# Patient Record
Sex: Male | Born: 1955 | Race: White | Hispanic: Yes | Marital: Married | Smoking: Former smoker
Health system: Southern US, Community
[De-identification: ages and names within clinical notes are randomized; demographics above are authoritative.]

---

## 2000-04-08 ENCOUNTER — Encounter: Payer: Self-pay | Admitting: Family Medicine

## 2000-04-08 ENCOUNTER — Ambulatory Visit (HOSPITAL_COMMUNITY): Admission: RE | Admit: 2000-04-08 | Discharge: 2000-04-08 | Payer: Self-pay | Admitting: Family Medicine

## 2001-02-11 ENCOUNTER — Ambulatory Visit (HOSPITAL_COMMUNITY): Admission: RE | Admit: 2001-02-11 | Discharge: 2001-02-11 | Payer: Self-pay | Admitting: Orthopedic Surgery

## 2001-02-11 ENCOUNTER — Encounter: Payer: Self-pay | Admitting: Orthopedic Surgery

## 2005-08-05 ENCOUNTER — Encounter: Admission: RE | Admit: 2005-08-05 | Discharge: 2005-08-05 | Payer: Self-pay | Admitting: Occupational Medicine

## 2006-06-05 ENCOUNTER — Ambulatory Visit: Payer: Self-pay | Admitting: Family Medicine

## 2006-06-05 LAB — CONVERTED CEMR LAB
HCT: 43.2 % (ref 39.0–52.0)
Hemoglobin: 14.8 g/dL (ref 13.0–17.0)
MCHC: 34.3 g/dL (ref 30.0–36.0)
MCV: 87.8 fL (ref 78.0–100.0)
PSA: 0.32 ng/mL (ref 0.10–4.00)
Platelets: 183 10*3/uL (ref 150–400)
RBC: 4.92 M/uL (ref 4.22–5.81)
RDW: 11.4 % — ABNORMAL LOW (ref 11.5–14.6)
WBC: 4.9 10*3/uL (ref 4.5–10.5)

## 2006-06-09 ENCOUNTER — Ambulatory Visit: Payer: Self-pay | Admitting: Gastroenterology

## 2006-06-15 ENCOUNTER — Ambulatory Visit: Payer: Self-pay | Admitting: Gastroenterology

## 2006-06-15 ENCOUNTER — Encounter (INDEPENDENT_AMBULATORY_CARE_PROVIDER_SITE_OTHER): Payer: Self-pay | Admitting: Specialist

## 2006-06-30 ENCOUNTER — Ambulatory Visit: Payer: Self-pay | Admitting: Gastroenterology

## 2007-06-29 ENCOUNTER — Ambulatory Visit: Payer: Self-pay | Admitting: Cardiology

## 2007-07-12 ENCOUNTER — Ambulatory Visit: Payer: Self-pay

## 2007-07-15 HISTORY — PX: SHOULDER ARTHROSCOPY: SHX128

## 2007-07-23 ENCOUNTER — Ambulatory Visit: Payer: Self-pay

## 2007-07-23 ENCOUNTER — Encounter (INDEPENDENT_AMBULATORY_CARE_PROVIDER_SITE_OTHER): Payer: Self-pay | Admitting: Family Medicine

## 2007-07-23 ENCOUNTER — Ambulatory Visit: Payer: Self-pay | Admitting: Cardiology

## 2010-08-04 ENCOUNTER — Encounter: Payer: Self-pay | Admitting: Occupational Medicine

## 2010-11-26 NOTE — Assessment & Plan Note (Signed)
[facility]                            CARDIOLOGY OFFICE NOTE   Phillip Lucero, Phillip Lucero               MRN:          [id number]  DATE:07/23/2007                            DOB:          January 15, 1956    PRIMARY CARE PHYSICIAN:  Leanne Chang, M.D.   REASON FOR VISIT:  Followup cardiac testing.   HISTORY OF PRESENT ILLNESS:  I saw Mr. Dimmer back in December.  He was referred at that time with an abnormal electrocardiogram in the  setting of baseline NYHA class II dyspnea and exertion without any  significant change and cardiac risk factors including age, gender,  tobacco use and mild hyperlipidemia treated with Tricor and omega-3  supplements.  I referred him for basic cardiac risk stratification  including a surface echocardiogram which showed normal ventricular  systolic function with an ejection fraction of 55-60% and mild diastolic  dysfunction without regional wall motion abnormalities.  He had mild  mitral regurgitation.  He subsequently underwent an exercise  echocardiogram which showed no electrocardiographic changes to suggest  ischemia and overall normal wall motion with exercise.  I reviewed these  results with him today and reassured him.  This would suggest a low risk  of obstructive cardiovascular disease and in general suggests that he  should continue with basic risk factor modification strategies.  He was  comfortable with this.   ALLERGIES:  No known drug allergies.   PRESENT MEDICATIONS:  Omega-3 supplements, hydrocodone p.r.n., Robaxin  p.r.n., zolpidem 10 mg p.o. daily at bedtime, omeprazole 20 mg p.o.  b.i.d., TriCor.   REVIEW OF SYSTEMS:  As described in history of present illness.   PHYSICAL EXAMINATION:  VITAL SIGNS:  Blood pressure is 125/84, heart  rate is in the 80s, weight 187 pounds.  GENERAL:  The patient is comfortable and in no acute distress.  He is  wearing a sling on his right arm.  NECK:  Neck  reveals no elevated jugular venous pressure, no loud bruits.  No thyromegaly noted.  LUNGS:  Lungs are clear without heavy breathing at rest.  CARDIAC:  Exam reveals a regular rate and rhythm.  No loud murmur, S3 or  gallop.  EXTREMITIES:  Show no pitting edema.   IMPRESSION AND RECOMMENDATIONS:  1. Reassuring cardiac evaluation showing normal left ventricular      systolic function and no clear evidence of ischemic heart disease      based on an exercise echocardiogram.  I suspect that his      electrocardiographic abnormalities are rather nonspecific and that      he should proceed with basic plan for risk factor modification.  He      states that he is interested in taking a baby aspirin a day.  I      have encouraged him to keep a close eye on his cholesterol with Dr.      Blossom Hoops.  He states that he has stopped smoking and I      congratulated him on this.  2. In addition, Mr. Glaude states that he is supposed to have      shoulder surgery in the near  future with Dr. Ranell Patrick of [facility].  I think that the patient should be able to proceed      with an adequate perioperative cardiac risk based on his recent      evaluation.     Jonelle Sidle, MD  Electronically Signed    SGM/MedQ  DD: 07/23/2007  DT: 07/23/2007  Job #: [id number]   cc:   Leanne Chang, M.D.  Almedia Balls. Ranell Patrick, M.D.

## 2010-11-26 NOTE — Assessment & Plan Note (Signed)
[facility]                            CARDIOLOGY OFFICE NOTE   DESTINE, AMBROISE               MRN:          [id number]  DATE:06/29/2007                            DOB:          [birth year]    CARDIOLOGY CONSULTATION   PRIMARY CARE PHYSICIAN:  Leanne Chang, M.D.   REASON FOR CONSULTATION:  Abnormal electrocardiogram.   HISTORY OF PRESENT ILLNESS:  Mr. Innocent is a pleasant 55-year-  old male with a reported history of mild hyperlipidemia treated with  omega 3 supplements and no documented history of cardiovascular disease  or myocardial infarction.  He reports being told that he has had an  abnormal electrocardiogram before and may have in fact undergone prior  stress testing approximately 2-3 years ago, although he cannot recall  the specifics.  He reported recently for a routine yearly physical and  saw Dr. Neva Seat at [facility].  He was told at that  time he had an abnormal electrocardiogram and in retrospect thought he  may have been having some shortness of breath, although today he tells  me that overall he has been feeling fine until recently.  He has been  worried about his heart because of his electrocardiogram.  He was told  that he may have had an old myocardial infarction affecting the inferior  wall.  In reviewing his electrocardiogram available today, he has  technically nondiagnostic Q waves in leads II, III, and aVF with counter-  clockwise rotation.  Today's electrocardiogram shows no clear evidence  of previous inferior wall myocardial infarction.  He has small R primes  in leads V1 and V2 and a vertical axis.  He denies having any clear  exertional chest pain.  He describes NYHA class II dyspnea on exertion  without any major progression.  He has not had any recent ischemic  evaluation.   ALLERGIES:  No known drug allergies.   CURRENT MEDICATIONS:  1. Omega 3 supplements.  2.  Hydrocodone 5/500 p.r.n.  3. Robaxin 500 p.r.n.  4. Zolpidem 10 mg p.r.n. nightly.  5. Prilosec 20 mg p.o. b.i.d.   PAST MEDICAL HISTORY:  As outlined above.  Patient reports problems with  reflux.  He denies any previous surgeries.  He has had some right  shoulder discomfort and may be in need of surgery for this.  No obvious  problems with diabetes mellitus, hypertension, or cardiovascular  disease.   REVIEW OF SYSTEMS:  As described in the history of present illness.  Patient describes seasonal allergies, reflux, some history of erectile  dysfunction.   FAMILY HISTORY:  Reviewed.  It is noncontributory to premature  cardiovascular disease.   SOCIAL HISTORY:  Patient is remarried.  He has five children.  He has a  tobacco use history, although was able to quit for two years recently.  He smokes approximately eight cigarettes a day and plans to quit at the  beginning of the year.  He is not exercising regularly.  He works as an  Retail banker at [employer].   PHYSICAL EXAMINATION:  Blood pressure is 125/78.  Heart rate is 65.  Weight is 185  pounds.  A normally nourished-appearing male in no acute distress.  HEENT:  Conjunctivae and lids normal.  Oropharynx is clear.  NECK:  Supple. No elevated jugular venous distention or loud bruits.  No  thyromegaly is noted.  LUNGS:  Clear without labored breathing at rest.  CARDIAC:  Regular rate and rhythm.  Normal first and second heart  sounds.  No S3 gallop or pericardial rub.  ABDOMEN:  Soft and nontender.  EXTREMITIES:  No pitting edema.  Skin is warm and dry.  MUSCULOSKELETAL:  No kyphosis is noted.  Distal pulses are 2+.  NEURO/PSYCHIATRIC:  Patient is alert and oriented x3.  Affect is normal.   IMPRESSION/RECOMMENDATIONS:  1. Abnormal electrocardiogram without frank evidence of previous      inferior wall myocardial infarction.  I have no other old tracings      for comparison.  Symptomatically, the patient has NYHA class II       dyspnea on exertion which is not significantly changed, and he      denies any active anginal chest pain.  Principle risk factors      include age, gender, tobacco use, and mild hyperlipidemia.  Our      plan will be an exercise Myoview and echocardiogram for further      risk stratification.  He will then follow up in the office to      discuss results.  2. Further plans to follow.     Jonelle Sidle, MD  Electronically Signed    SGM/MedQ  DD: 06/29/2007  DT: 06/30/2007  Job #: [id number]   cc:   Leanne Chang, M.D.

## 2010-11-29 NOTE — Assessment & Plan Note (Signed)
[facility]                         GASTROENTEROLOGY OFFICE NOTE   [name], [name]               MRN:          [id number]  DATE:06/30/2006                            DOB:          [birth year]    Phillip Lucero's colonoscopy was unremarkable.  I suspect he had some rectal  irritation which has resolved.  His endoscopic exam showed a hiatal  hernia with 5 cm of rather severe esophagitis.  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   He is currently asymptomatic on Prevacid 30 mg a day, whereas he had no  response to previous omeprazole 40 mg a day.  I have changed him to  Aciphex 20 mg a day to abide with his drug plan.  He is to continue his  Benefiber and daily liberal p.o. fluids.  I have discussed the reflux  regimen with him.  We will see him back in 6 months' time on a p.r.n.  basis as needed.  He will need followup endoscopic exam in 2 years'  time.     Vania Rea. Jarold Motto, MD, Caleen Essex, FAGA  Electronically Signed    DRP/MedQ  DD: 06/30/2006  DT: 07/01/2006  Job #: [id number]   cc:   Leanne Chang, M.D.

## 2010-11-29 NOTE — Assessment & Plan Note (Signed)
[facility]                           GASTROENTEROLOGY OFFICE NOTE   NGUYEN, TODOROV               MRN:          [id number]  DATE:06/09/2006                            DOB:          Oct 06, 1955    Mr. Phillip Lucero is a 55 year old [place] referred by Dr.  Blossom Hoops for evaluation of some rectal bleeding and painful hemorrhoid  problems.   Mr. Phillip Lucero has had some irregular bowel movements associated with rectal  pain and bleeding over the last few weeks. He denies abdominal pain or other  lower gastrointestinal problems. He does have chronic acid reflux for  greater than 10 years and he has used over-the-counter antacids and Pepcid  AC for many years. Most of his reflux is at night and associated with marked  regurgitation and he currently is on Omeprazole 20 mg twice a day with mild  improvement. He denies associated dysphagia or hepatobiliary complaints. He  has had previous lower abdominal pain which is related to lifting and had CT  scan of his pelvis performed in January 2007 which was otherwise  unremarkable. He did have a reaction to the contrast dye. CBC at that time  and PSA level were normal.   The patient follows a regular diet but does try to avoid tomato-based  products because of his acid reflux. He denies clay colored stools, dark  urine, icterus, fever or chills, anorexia or weight loss. He does not have  lactose intolerance.   PAST MEDICAL HISTORY:  Otherwise entirely noncontributory.   FAMILY HISTORY:  Remarkable for a brother who had prostate cancer and  several members with diabetes. There are no known gastrointestinal problems.   SOCIAL HISTORY:  The patient is married and lives with his wife and  children. He has 2 years of college education. He smokes 1/2 to 1 pack of  cigarettes per day and uses ethanol socially on weekends. He denies problems  with ethanol abuse.   MEDICATIONS:  1.  Omeprazole 40 mg a day.  2. Lopid 600 mg 2 a day.  3. Daily fish oil.  4. Denies drug allergies.   REVIEW OF SYSTEMS:  Entirely noncontributory. He does have some nonspecific  complaints such as chronic fatigue, myalgias, back pain, headaches, night  sweats, and mild lower extremity edema.   PHYSICAL EXAMINATION:  GENERAL:  He is a healthy-appearing [place] male in  no acute distress appearing his stated age.  VITAL SIGNS:  He is 5 feet 7 inches tall and weighs 179 pounds. Blood  pressure is 102/60, pulse was 68 and regular.  There were no stigmata of chronic liver disease noted or thyromegaly or  lymphadenopathy.  CHEST:  Entirely clear to percussion and auscultation.  CARDIAC:  He was in a regular rhythm without murmur, gallop or rub noted on  cardiac exam.  ABDOMEN:  I could not appreciate hepatosplenomegaly, abdominal masses or  tenderness.  EXTREMITIES:  Unremarkable.  MENTAL STATUS:  Clear.  RECTAL:  Inspection of the rectum was unremarkable as was rectal exam. Stool  was guaiac negative.   ASSESSMENT:  1. Chronic acid reflux - rule out Barrett's mucosa.  2. Recurrent  rectal bleeding probably from internal hemorrhoids - rule out      colon polyps.  3. Family history of prostate carcinoma with normal previous PSA level.   RECOMMENDATIONS:  1. Reflux regimen, continue twice a day Prilosec.  2. Outpatient endoscopic exam.  3. High fiber diet with daily Benefiber and liberal p.o. fluids.  4. Outpatient colonoscopy exam.  5. Continue other medications and followup with Dr.  Blossom Hoops as      previously planned.     Vania Rea. Jarold Motto, MD, Caleen Essex, FAGA  Electronically Signed    DRP/MedQ  DD: 06/09/2006  DT: 06/09/2006  Job #: [id number]

## 2016-06-23 ENCOUNTER — Encounter: Payer: Self-pay | Admitting: Physician Assistant

## 2016-06-23 ENCOUNTER — Ambulatory Visit (INDEPENDENT_AMBULATORY_CARE_PROVIDER_SITE_OTHER): Payer: 59 | Admitting: Physician Assistant

## 2016-06-23 VITALS — BP 133/69 | HR 84 | Ht 66.0 in | Wt 197.0 lb

## 2016-06-23 DIAGNOSIS — F5101 Primary insomnia: Secondary | ICD-10-CM

## 2016-06-23 DIAGNOSIS — J301 Allergic rhinitis due to pollen: Secondary | ICD-10-CM

## 2016-06-23 DIAGNOSIS — K2270 Barrett's esophagus without dysplasia: Secondary | ICD-10-CM | POA: Diagnosis not present

## 2016-06-23 DIAGNOSIS — Z9989 Dependence on other enabling machines and devices: Secondary | ICD-10-CM

## 2016-06-23 DIAGNOSIS — K2100 Gastro-esophageal reflux disease with esophagitis, without bleeding: Secondary | ICD-10-CM

## 2016-06-23 DIAGNOSIS — E118 Type 2 diabetes mellitus with unspecified complications: Secondary | ICD-10-CM

## 2016-06-23 DIAGNOSIS — Z1211 Encounter for screening for malignant neoplasm of colon: Secondary | ICD-10-CM

## 2016-06-23 DIAGNOSIS — M25511 Pain in right shoulder: Secondary | ICD-10-CM

## 2016-06-23 DIAGNOSIS — N521 Erectile dysfunction due to diseases classified elsewhere: Secondary | ICD-10-CM

## 2016-06-23 DIAGNOSIS — E785 Hyperlipidemia, unspecified: Secondary | ICD-10-CM

## 2016-06-23 DIAGNOSIS — G4700 Insomnia, unspecified: Secondary | ICD-10-CM | POA: Insufficient documentation

## 2016-06-23 DIAGNOSIS — G8929 Other chronic pain: Secondary | ICD-10-CM

## 2016-06-23 DIAGNOSIS — N529 Male erectile dysfunction, unspecified: Secondary | ICD-10-CM | POA: Insufficient documentation

## 2016-06-23 DIAGNOSIS — K219 Gastro-esophageal reflux disease without esophagitis: Secondary | ICD-10-CM | POA: Insufficient documentation

## 2016-06-23 DIAGNOSIS — E1140 Type 2 diabetes mellitus with diabetic neuropathy, unspecified: Secondary | ICD-10-CM | POA: Insufficient documentation

## 2016-06-23 DIAGNOSIS — Z87891 Personal history of nicotine dependence: Secondary | ICD-10-CM

## 2016-06-23 DIAGNOSIS — G4733 Obstructive sleep apnea (adult) (pediatric): Secondary | ICD-10-CM

## 2016-06-23 LAB — POCT GLYCOSYLATED HEMOGLOBIN (HGB A1C): HEMOGLOBIN A1C: 6.9

## 2016-06-23 MED ORDER — GLIMEPIRIDE 2 MG PO TABS: ORAL_TABLET | ORAL | 1 refills | Status: DC

## 2016-06-23 MED ORDER — OMEPRAZOLE 40 MG PO CPDR
40.0000 mg | DELAYED_RELEASE_CAPSULE | Freq: Every day | ORAL | 1 refills | Status: AC
Start: 2016-06-23 — End: ?

## 2016-06-23 MED ORDER — LISINOPRIL 10 MG PO TABS: 10.0000 mg | ORAL_TABLET | Freq: Every day | ORAL | 1 refills | Status: AC

## 2016-06-23 MED ORDER — DICLOFENAC SODIUM 2 % TD SOLN: TRANSDERMAL | 1 refills | Status: DC

## 2016-06-23 MED ORDER — GABAPENTIN 300 MG PO CAPS: 300.0000 mg | ORAL_CAPSULE | Freq: Every day | ORAL | 1 refills | Status: AC

## 2016-06-23 MED ORDER — ATORVASTATIN CALCIUM 20 MG PO TABS: 20.0000 mg | ORAL_TABLET | Freq: Every day | ORAL | 1 refills | Status: AC

## 2016-06-23 MED ORDER — METFORMIN HCL ER 500 MG PO TB24
ORAL_TABLET | ORAL | 1 refills | Status: DC
Start: 2016-06-23 — End: 2016-09-22

## 2016-06-23 MED ORDER — FEXOFENADINE HCL 180 MG PO TABS: 180.0000 mg | ORAL_TABLET | Freq: Every day | ORAL | 1 refills | Status: AC

## 2016-06-23 MED ORDER — SILDENAFIL CITRATE 20 MG PO TABS: ORAL_TABLET | ORAL | 1 refills | Status: DC

## 2016-06-23 MED ORDER — ZOLPIDEM TARTRATE ER 6.25 MG PO TBCR: 6.2500 mg | EXTENDED_RELEASE_TABLET | Freq: Every day | ORAL | 1 refills | Status: DC

## 2016-06-23 NOTE — Patient Instructions (Signed)
Find out when last pneuomoccal 23 vaccine was.  Will schedule for endoscopy/colonoscopy.

## 2016-06-23 NOTE — Progress Notes (Signed)
Subjective:    Patient ID: Phillip Lucero, male    DOB: February 12, 1956, 60 y.o.   MRN: [name]  HPI  Pt is a 60 yo male who presents to the clinic to establish care.   .. Active Ambulatory Problems    Diagnosis Date Noted  . Hyperlipidemia 06/23/2016  . Diabetic neuropathy associated with type 2 diabetes mellitus (HCC) 06/23/2016  . GERD (gastroesophageal reflux disease) 06/23/2016  . Barrett esophagus 06/23/2016  . Insomnia 06/23/2016  . Erectile dysfunction 06/23/2016  . OSA on CPAP 06/23/2016  . Type 2 diabetes mellitus with complication, without long-term current use of insulin (HCC) 06/26/2016   Resolved Ambulatory Problems    Diagnosis Date Noted  . No Resolved Ambulatory Problems   No Additional Past Medical History   .Phillip Lucero. Social History   Social History  . Marital status: Married    Spouse name: N/A  . Number of children: N/A  . Years of education: N/A   Occupational History  . Not on file.   Social History Main Topics  . Smoking status: Former Games developermoker  . Smokeless tobacco: Never Used  . Alcohol use Yes  . Drug use: No  . Sexual activity: Yes   Other Topics Concern  . Not on file   Social History Narrative  . No narrative on file   Pt needs routine follow up on medical conditions.   DM type II- seems to be doing well. Taking oral medications. Neuropathy controlled. Not checking sugars regularly. No hypoglycemic events. No wound or sores on extermities.   Pt has GERD/Barretts and cannot take NSAIDs. He has right shoulder pain ongoing for last year. Surgery in 2009. Struggling to pick up things with right hand. Hurts with any ROM. Some numbness radiating into right pinky and ring finger.     Review of Systems  All other systems reviewed and are negative.      Objective:   Physical Exam  Constitutional: He is oriented to person, place, and time. He appears well-developed and well-nourished.  HENT:  Head: Normocephalic and atraumatic.   Cardiovascular: Normal rate, regular rhythm and normal heart sounds.   Pulmonary/Chest: Effort normal and breath sounds normal.  Musculoskeletal:  Tenderness over acromion and anterior shoulder over bicep tendon insertion.  Limited ROM due to pain at 110 degrees abduction. Pain worsens with abduction.  Positive hawkins.  Strength decreased on right side 3-4/5.   Neurological: He is alert and oriented to person, place, and time.  Skin: Skin is dry.  Psychiatric: He has a normal mood and affect. His behavior is normal.          Assessment & Plan:  Phillip Lucero.Phillip Lucero.Phillip Lucero was seen today for establish care and diabetes.  Diagnoses and all orders for this visit:  Type 2 diabetes mellitus with complication, without long-term current use of insulin (HCC) -     POCT HgB A1C -     metFORMIN (GLUCOPHAGE-XR) 500 MG 24 hr tablet; TAKE 4 TABLETS BY MOUTH  WITH BREAKFAST AS DIRECTED -     lisinopril (PRINIVIL,ZESTRIL) 10 MG tablet; Take 1 tablet (10 mg total) by mouth daily. -     glimepiride (AMARYL) 2 MG tablet; TAKE 1 TABLET BY MOUTH 30  MINUTES BEFORE BREAKFAST AS DIRECTED -     gabapentin (NEURONTIN) 300 MG capsule; Take 1 capsule (300 mg total) by mouth at bedtime.  Hyperlipidemia, unspecified hyperlipidemia type -     atorvastatin (LIPITOR) 20 MG tablet; Take 1 tablet (20 mg total) by  mouth daily.  Gastroesophageal reflux disease with esophagitis -     omeprazole (PRILOSEC) 40 MG capsule; Take 1 capsule (40 mg total) by mouth daily. -     Ambulatory referral to Gastroenterology  Barrett's esophagus without dysplasia -     omeprazole (PRILOSEC) 40 MG capsule; Take 1 capsule (40 mg total) by mouth daily. -     Ambulatory referral to Gastroenterology  Primary insomnia -     zolpidem (AMBIEN CR) 6.25 MG CR tablet; Take 1 tablet (6.25 mg total) by mouth at bedtime.  Erectile dysfunction due to diseases classified elsewhere -     sildenafil (REVATIO) 20 MG tablet; TAKE AS FEW AS 2 AND AS MANY AS 5  AT LEAST 1 HOUR PRIOR TO SEX  OSA on CPAP  Chronic seasonal allergic rhinitis due to pollen -     fexofenadine (ALLEGRA) 180 MG tablet; Take 1 tablet (180 mg total) by mouth daily.  Chronic right shoulder pain -     Diclofenac Sodium (PENNSAID) 2 % SOLN; 2 pumps applied to painful area twice a day.  Former smoker -     Ambulatory referral to Gastroenterology  Colon cancer screening -     Ambulatory referral to Gastroenterology   A!C 6.9 today. At goal. Continue on medications.  Need to find out about pneumonia vaccines.  Pt declined flu shot.  Encouraged eye exam.   Declined xray today of shoulder. Discussed sports medicine appt would be good for evaluation. Pt request MRI to be ordered. He says insurance shouldn't give any problems. Will confirm if needs xray first.  Diclofenac topical  given due to barretts and GERD.  Seems like some impingement syndrome could be going on in the right shoulder. Exercises given.   GI referral made.   Needs CPE and labs.

## 2016-06-26 ENCOUNTER — Encounter: Payer: Self-pay | Admitting: Physician Assistant

## 2016-06-26 ENCOUNTER — Telehealth: Payer: Self-pay | Admitting: Physician Assistant

## 2016-06-26 DIAGNOSIS — E118 Type 2 diabetes mellitus with unspecified complications: Secondary | ICD-10-CM | POA: Insufficient documentation

## 2016-06-27 ENCOUNTER — Other Ambulatory Visit: Payer: Self-pay | Admitting: Physician Assistant

## 2016-06-27 DIAGNOSIS — N521 Erectile dysfunction due to diseases classified elsewhere: Secondary | ICD-10-CM

## 2016-07-21 ENCOUNTER — Encounter: Payer: Self-pay | Admitting: Physician Assistant

## 2016-07-21 ENCOUNTER — Ambulatory Visit (INDEPENDENT_AMBULATORY_CARE_PROVIDER_SITE_OTHER): Payer: 59 | Admitting: Physician Assistant

## 2016-07-21 VITALS — BP 145/65 | HR 92 | Ht 66.0 in | Wt 196.0 lb

## 2016-07-21 DIAGNOSIS — M25611 Stiffness of right shoulder, not elsewhere classified: Secondary | ICD-10-CM

## 2016-07-21 DIAGNOSIS — S46819A Strain of other muscles, fascia and tendons at shoulder and upper arm level, unspecified arm, initial encounter: Secondary | ICD-10-CM

## 2016-07-21 DIAGNOSIS — M25511 Pain in right shoulder: Secondary | ICD-10-CM | POA: Diagnosis not present

## 2016-07-21 DIAGNOSIS — F5101 Primary insomnia: Secondary | ICD-10-CM

## 2016-07-21 DIAGNOSIS — K439 Ventral hernia without obstruction or gangrene: Secondary | ICD-10-CM

## 2016-07-21 DIAGNOSIS — G8929 Other chronic pain: Secondary | ICD-10-CM

## 2016-07-21 DIAGNOSIS — IMO0001 Reserved for inherently not codable concepts without codable children: Secondary | ICD-10-CM

## 2016-07-21 MED ORDER — DICLOFENAC SODIUM 2 % TD SOLN: TRANSDERMAL | 1 refills | Status: DC

## 2016-07-21 MED ORDER — ZOLPIDEM TARTRATE 10 MG PO TABS: 10.0000 mg | ORAL_TABLET | Freq: Every evening | ORAL | 1 refills | Status: AC | PRN

## 2016-07-21 NOTE — Progress Notes (Signed)
   Subjective:    Patient ID: Phillip Lucero, male    DOB: [birth year], 61 y.o.   MRN: [id number]  HPI  Pt is a 61 yo male who presents to the clinic to follow up on right shoulder pain.   He never got diclofenac sent to pharmacy. Hx of right shoulder pain. Last surgery 2009 for rotator cuff with [facility].7 months after surgery almost all pain resolved. New pain started 3 months ago. Hard to lift grandchild. Pain with external rotation. Weakness in right extermity. He reports he does not need xray that insurance will pay for MRI.   He also has a bulge in his abdomen. Been present for 2 and 1/2 years. Was seen in [place] for this. They told him surgery was not an option. He wears a belt to help keep bulge in. Bothers him a lot. It is painful at times. He cannot exercise like he wants to due to discomfort.    Review of Systems  All other systems reviewed and are negative.      Objective:   Physical Exam  Constitutional: He is oriented to person, place, and time. He appears well-developed and well-nourished.  HENT:  Head: Normocephalic and atraumatic.  Cardiovascular: Normal rate, regular rhythm and normal heart sounds.   Pulmonary/Chest: Effort normal and breath sounds normal.  Abdominal:  Noted ventral hernia, reducible.   Musculoskeletal:  4/5 right upper extremity strength.  ROM 120 degrees abduction with pain assoicated.  Negative drop arm test.  Negative speeds test.  Positive hawkins.  Pain with all external rotation.  No tenderness to palpation of acromion and ant/posterior joint space.   Neurological: He is alert and oriented to person, place, and time.  Psychiatric: He has a normal mood and affect. His behavior is normal.          Assessment & Plan:  [name].[name].Diagnoses and all orders for this visit:  Chronic right shoulder pain -     MR SHOULDER RIGHT WO CONTRAST; Future -     Diclofenac Sodium (PENNSAID) 2 % SOLN; 2 pumps applied to painful area twice a  day. -     MR SHOULDER RIGHT WO CONTRAST  Decreased ROM of right shoulder -     MR SHOULDER RIGHT WO CONTRAST; Future -     MR SHOULDER RIGHT WO CONTRAST  Ventral hernia without obstruction or gangrene -     Ambulatory referral to General Surgery  Primary insomnia -     zolpidem (AMBIEN) 10 MG tablet; Take 1 tablet (10 mg total) by mouth at bedtime as needed for sleep.   Resent diclofenac topical to try.  Will follow up with sports med after MRI>  I suspect more impingement than rotator cuff pathology.  Will consider PT.   Discussed ventral hernia. Pt would like consult.   Colonoscopy schedule for January 19th.

## 2016-07-21 NOTE — Patient Instructions (Signed)
MRI ordered.  Diclofenac sent to pharmacy.

## 2016-07-23 DIAGNOSIS — G8929 Other chronic pain: Secondary | ICD-10-CM | POA: Insufficient documentation

## 2016-07-23 DIAGNOSIS — M25611 Stiffness of right shoulder, not elsewhere classified: Secondary | ICD-10-CM | POA: Insufficient documentation

## 2016-07-23 DIAGNOSIS — K439 Ventral hernia without obstruction or gangrene: Secondary | ICD-10-CM | POA: Insufficient documentation

## 2016-07-23 DIAGNOSIS — M25511 Pain in right shoulder: Principal | ICD-10-CM

## 2016-07-28 ENCOUNTER — Ambulatory Visit (INDEPENDENT_AMBULATORY_CARE_PROVIDER_SITE_OTHER): Payer: 59

## 2016-07-28 ENCOUNTER — Encounter: Payer: Self-pay | Admitting: Physician Assistant

## 2016-07-28 DIAGNOSIS — M7591 Shoulder lesion, unspecified, right shoulder: Secondary | ICD-10-CM

## 2016-07-28 DIAGNOSIS — M75101 Unspecified rotator cuff tear or rupture of right shoulder, not specified as traumatic: Secondary | ICD-10-CM | POA: Diagnosis not present

## 2016-07-28 DIAGNOSIS — IMO0001 Reserved for inherently not codable concepts without codable children: Secondary | ICD-10-CM | POA: Insufficient documentation

## 2016-07-28 DIAGNOSIS — Z9889 Other specified postprocedural states: Secondary | ICD-10-CM

## 2016-07-28 DIAGNOSIS — S46819A Strain of other muscles, fascia and tendons at shoulder and upper arm level, unspecified arm, initial encounter: Secondary | ICD-10-CM

## 2016-07-28 NOTE — Progress Notes (Signed)
MRI of shoulder shows inflammation in the tendon with a small bursal sided tear of one of the muscles of shoulders. I am going to refer you to ortho. Do you have a preference? Do you want to go back to [facility]?

## 2016-07-28 NOTE — Addendum Note (Signed)
Addended by: Jomarie LongsBREEBACK, Gladiola Madore L on: 07/28/2016 10:08 PM   Modules accepted: Orders

## 2016-09-05 ENCOUNTER — Other Ambulatory Visit: Payer: Self-pay | Admitting: Physician Assistant

## 2016-09-05 DIAGNOSIS — N521 Erectile dysfunction due to diseases classified elsewhere: Secondary | ICD-10-CM

## 2016-09-22 ENCOUNTER — Encounter: Payer: Self-pay | Admitting: Physician Assistant

## 2016-09-22 ENCOUNTER — Ambulatory Visit (INDEPENDENT_AMBULATORY_CARE_PROVIDER_SITE_OTHER): Payer: 59 | Admitting: Physician Assistant

## 2016-09-22 VITALS — BP 123/74 | HR 78 | Ht 66.0 in | Wt 197.0 lb

## 2016-09-22 DIAGNOSIS — M25511 Pain in right shoulder: Secondary | ICD-10-CM

## 2016-09-22 DIAGNOSIS — E118 Type 2 diabetes mellitus with unspecified complications: Secondary | ICD-10-CM

## 2016-09-22 DIAGNOSIS — G8929 Other chronic pain: Secondary | ICD-10-CM

## 2016-09-22 LAB — POCT GLYCOSYLATED HEMOGLOBIN (HGB A1C): HEMOGLOBIN A1C: 7.7

## 2016-09-22 MED ORDER — DICLOFENAC SODIUM 2 % TD SOLN: TRANSDERMAL | 1 refills | Status: AC

## 2016-09-22 MED ORDER — CANAGLIFLOZIN-METFORMIN HCL ER 150-1000 MG PO TB24: 1.0000 | ORAL_TABLET | Freq: Every day | ORAL | 2 refills | Status: DC

## 2016-09-22 MED ORDER — GLIMEPIRIDE 2 MG PO TABS: ORAL_TABLET | ORAL | 1 refills | Status: AC

## 2016-09-22 NOTE — Patient Instructions (Signed)
Added invokamet XR once a day in morning.  Glimperide 2mg  once daily in the morning.

## 2016-09-22 NOTE — Progress Notes (Signed)
   Subjective:    Patient ID: Phillip Lucero, male    DOB: 05-Jul-1956, 61 y.o.   MRN: [id number]  HPI Pt is a 61 yo male who presents to the clinic for 3 month DM check up. He is not checking sugars. He remembers to take his medications "most of the time". He denies any hypoglycemia, open sores, or open wounds. He does admit to eating more tortillas approximately 7-10 a day. He has not been exercising  due to weather.    Review of Systems  All other systems reviewed and are negative.      Objective:   Physical Exam  Constitutional: He is oriented to person, place, and time. He appears well-developed and well-nourished.  HENT:  Head: Normocephalic and atraumatic.  Cardiovascular: Normal rate, regular rhythm and normal heart sounds.   Pulmonary/Chest: Effort normal and breath sounds normal.  Neurological: He is alert and oriented to person, place, and time.  Psychiatric: He has a normal mood and affect. His behavior is normal.          Assessment & Plan:  [name].[name].Phillip Lucero was seen today for diabetes.  Diagnoses and all orders for this visit:  Type 2 diabetes mellitus with complication, without long-term current use of insulin (HCC) -     POCT HgB A1C -     Canagliflozin-Metformin HCl ER (INVOKAMET XR) [phone] MG TB24; Take 1 tablet by mouth daily. -     glimepiride (AMARYL) 2 MG tablet; TAKE 1 TABLET BY MOUTH 30  MINUTES BEFORE BREAKFAST AS DIRECTED  Chronic right shoulder pain -     Diclofenac Sodium (PENNSAID) 2 % SOLN; 2 pumps applied to painful area twice a day.   .. Lab Results  Component Value Date   HGBA1C 7.7 09/22/2016   Up from 6.9 3 months ago.  Encouraged to start limited tortillas and other carbs/sugars.  Start invokamet. Discussed side effects and black box warning.  Continue amaryl in am.  Follow up in 3 months.  Encouraged 150 minutes of exercise a week.   Screening showed several days of downness- discussed finding hobby, going out with friends,  finding purpose. Pt is not suicidal. Follow up as needed.

## 2016-09-24 ENCOUNTER — Encounter: Payer: Self-pay | Admitting: Physician Assistant

## 2016-09-24 ENCOUNTER — Ambulatory Visit (INDEPENDENT_AMBULATORY_CARE_PROVIDER_SITE_OTHER): Payer: 59 | Admitting: Physician Assistant

## 2016-09-24 ENCOUNTER — Telehealth: Payer: Self-pay

## 2016-09-24 VITALS — BP 138/76 | HR 87

## 2016-09-24 DIAGNOSIS — T887XXA Unspecified adverse effect of drug or medicament, initial encounter: Secondary | ICD-10-CM | POA: Diagnosis not present

## 2016-09-24 DIAGNOSIS — E118 Type 2 diabetes mellitus with unspecified complications: Secondary | ICD-10-CM

## 2016-09-24 DIAGNOSIS — T50905A Adverse effect of unspecified drugs, medicaments and biological substances, initial encounter: Secondary | ICD-10-CM

## 2016-09-24 LAB — BASIC METABOLIC PANEL WITH GFR
BUN: 20 mg/dL (ref 7–25)
CHLORIDE: 102 mmol/L (ref 98–110)
CO2: 27 mmol/L (ref 20–31)
CREATININE: 1.05 mg/dL (ref 0.70–1.33)
Calcium: 9.5 mg/dL (ref 8.6–10.3)
GFR, Est African American: 89 mL/min (ref >=60)
GFR, Est Non African American: 77 mL/min (ref >=60)
Glucose, Bld: 162 mg/dL — ABNORMAL HIGH (ref 65–99)
POTASSIUM: 4.8 mmol/L (ref 3.5–5.3)
Sodium: 138 mmol/L (ref 135–146)

## 2016-09-24 MED ORDER — SITAGLIP PHOS-METFORMIN HCL ER 50-1000 MG PO TB24: 1.0000 | ORAL_TABLET | Freq: Every day | ORAL | 2 refills | Status: AC

## 2016-09-24 NOTE — Telephone Encounter (Signed)
Pt thinks he is having a adverse reaction to Invokamet.  He started it yesterday, and today feels very tired, Sleepy, blurry eyes, and headache.  Please advise

## 2016-09-24 NOTE — Telephone Encounter (Signed)
Pt has appt today

## 2016-09-24 NOTE — Patient Instructions (Signed)
Sitagliptin oral tablet What is this medicine? SITAGLIPTIN (sit a GLIP tin) helps to treat type 2 diabetes. It helps to control blood sugar. Treatment is combined with diet and exercise. This medicine may be used for other purposes; ask your health care provider or pharmacist if you have questions. COMMON BRAND NAME(S): Januvia What should I tell my health care provider before I take this medicine? They need to know if you have any of these conditions: -diabetic ketoacidosis -kidney disease -pancreatitis -previous swelling of the tongue, face, or lips with difficulty breathing, difficulty swallowing, hoarseness, or tightening of the throat -type 1 diabetes -an unusual or allergic reaction to sitagliptin, other medicines, foods, dyes, or preservatives -pregnant or trying to get pregnant -breast-feeding How should I use this medicine? Take this medicine by mouth with a glass of water. Follow the directions on the prescription label. You can take it with or without food. Do not cut, crush or chew this medicine. Take your dose at the same time each day. Do not take more often than directed. Do not stop taking except on your doctor's advice. A special MedGuide will be given to you by the pharmacist with each prescription and refill. Be sure to read this information carefully each time. Talk to your pediatrician regarding the use of this medicine in children. Special care may be needed. Overdosage: If you think you have taken too much of this medicine contact a poison control center or emergency room at once. NOTE: This medicine is only for you. Do not share this medicine with others. What if I miss a dose? If you miss a dose, take it as soon as you can. If it is almost time for your next dose, take only that dose. Do not take double or extra doses. What may interact with this medicine? Do not take this medicine with any of the following medications: -gatifloxacin This medicine may also interact  with the following medications: -alcohol -digoxin -insulin -sulfonylureas like glimepiride, glipizide, glyburide This list may not describe all possible interactions. Give your health care provider a list of all the medicines, herbs, non-prescription drugs, or dietary supplements you use. Also tell them if you smoke, drink alcohol, or use illegal drugs. Some items may interact with your medicine. What should I watch for while using this medicine? Visit your doctor or health care professional for regular checks on your progress. A test called the HbA1C (A1C) will be monitored. This is a simple blood test. It measures your blood sugar control over the last 2 to 3 months. You will receive this test every 3 to 6 months. Learn how to check your blood sugar. Learn the symptoms of low and high blood sugar and how to manage them. Always carry a quick-source of sugar with you in case you have symptoms of low blood sugar. Examples include hard sugar candy or glucose tablets. Make sure others know that you can choke if you eat or drink when you develop serious symptoms of low blood sugar, such as seizures or unconsciousness. They must get medical help at once. Tell your doctor or health care professional if you have high blood sugar. You might need to change the dose of your medicine. If you are sick or exercising more than usual, you might need to change the dose of your medicine. Do not skip meals. Ask your doctor or health care professional if you should avoid alcohol. Many nonprescription cough and cold products contain sugar or alcohol. These can affect blood sugar.   Wear a medical ID bracelet or chain, and carry a card that describes your disease and details of your medicine and dosage times. What side effects may I notice from receiving this medicine? Side effects that you should report to your doctor or health care professional as soon as possible: -allergic reactions like skin rash, itching or hives,  swelling of the face, lips, or tongue -breathing problems -general ill feeling or flu-like symptoms -joint pain -loss of appetite -redness, blistering, peeling or loosening of the skin, including inside the mouth -signs and symptoms of low blood sugar such as feeling anxious, confusion, dizziness, increased hunger, unusually weak or tired, sweating, shakiness, cold, irritable, headache, blurred vision, fast heartbeat, loss of consciousness -unusual stomach pain or discomfort -vomiting Side effects that usually do not require medical attention (report to your doctor or health care professional if they continue or are bothersome): -diarrhea -headache -sore throat -stomach upset -stuffy or runny nose This list may not describe all possible side effects. Call your doctor for medical advice about side effects. You may report side effects to FDA at 1-800-FDA-1088. Where should I keep my medicine? Keep out of the reach of children. Store at room temperature between 15 and 30 degrees C (59 and 86 degrees F). Throw away any unused medicine after the expiration date. NOTE: This sheet is a summary. It may not cover all possible information. If you have questions about this medicine, talk to your doctor, pharmacist, or health care provider.  2018 [employer]vier/Gold Standard (2015-12-28 14:23:55)  

## 2016-09-24 NOTE — Progress Notes (Signed)
   Subjective:    Patient ID: Phillip Lucero, male    DOB: 1955-10-07, 61 y.o.   MRN: [id number]  HPI  Pt is a 61 yo male who presents to the clinic 2 days after starting invokamet. He has had muscle cramps, vision changes, blurry vision, nausea, and fatigue. He took last dose this am. He thinks it is a medication side effect. Denies any CP, palpitations, headache, SOB, wheezing.    Review of Systems    see HPI.  Objective:   Physical Exam  Constitutional: He is oriented to person, place, and time. He appears well-developed and well-nourished. No distress.  HENT:  Head: Normocephalic and atraumatic.  Eyes: Conjunctivae and EOM are normal. Pupils are equal, round, and reactive to light. Right eye exhibits no discharge. Left eye exhibits no discharge.  Cardiovascular: Normal rate, regular rhythm and normal heart sounds.   Pulmonary/Chest: Effort normal and breath sounds normal. No respiratory distress. He has no wheezes. He has no rales. He exhibits no tenderness.  Neurological: He is alert and oriented to person, place, and time.  Skin: Skin is dry. He is not diaphoretic.  Psychiatric: He has a normal mood and affect. His behavior is normal.          Assessment & Plan:  [name].[name].Diagnoses and all orders for this visit:  Adverse effect of drug, initial encounter -     BASIC METABOLIC PANEL WITH GFR  Type 2 diabetes mellitus with complication, without long-term current use of insulin (HCC) -     SitaGLIPtin-MetFORMIN HCl 50-1000 MG TB24; Take 1 tablet by mouth daily.   I do believe symptoms represent medication reaction. Stop invokamet and added to allergy list.  Will get bmp to make sure potassium is normal.  Give good 3 days without medication and feeling better. Stay hydrated and rest next few days.   Given janumet to start after that for sugar control. Given HO with potential side effects. Follow up in 3 months or as needed.

## 2016-09-29 NOTE — Telephone Encounter (Signed)
error 

## 2016-12-24 ENCOUNTER — Ambulatory Visit: Payer: 59 | Admitting: Physician Assistant

## 2017-03-29 ENCOUNTER — Other Ambulatory Visit: Payer: Self-pay | Admitting: Physician Assistant

## 2017-03-29 DIAGNOSIS — K2100 Gastro-esophageal reflux disease with esophagitis, without bleeding: Secondary | ICD-10-CM

## 2017-03-29 DIAGNOSIS — K2270 Barrett's esophagus without dysplasia: Secondary | ICD-10-CM

## 2018-10-05 IMAGING — MR MR SHOULDER*R* W/O CM
5 series · 40 of 40 positions shown · non-contrast
Comparison: None.

CLINICAL DATA: Right shoulder pain and weakness for 3 months.
History of prior surgery.

EXAM:
MRI OF THE RIGHT SHOULDER WITHOUT CONTRAST
TECHNIQUE: Multiplanar, multisequence MR imaging of the shoulder was performed.
No intravenous contrast was administered.

[Series 3: T2 fat-sat · axial · 4.0mm · 0.59mm/px · z∈[-28,+55]mm · 8 of 20 slices shown (1 of 3)]
[im 1/20]
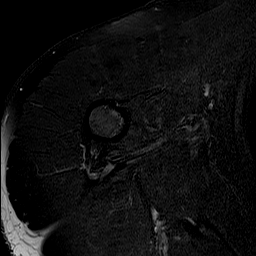
[im 3/20]
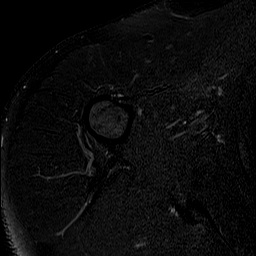
[im 6/20]
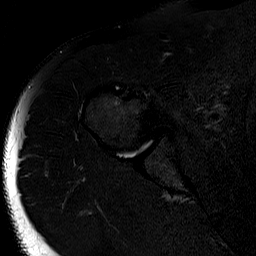
[im 9/20]
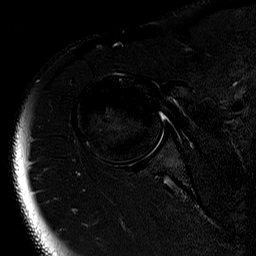
[im 11/20]
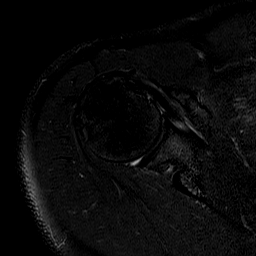
[im 14/20]
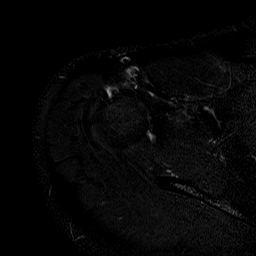
[im 17/20]
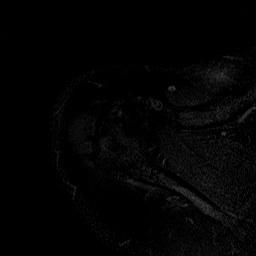
[im 20/20]
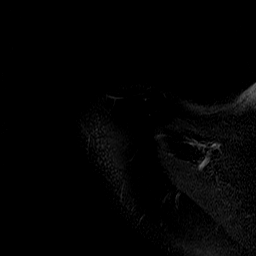

[Series 4: T2 fat-sat · oblique · 4.0mm · 0.59mm/px · 8 of 18 slices shown (2 of 3)]
[im 1/18]
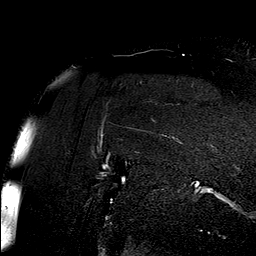
[im 3/18]
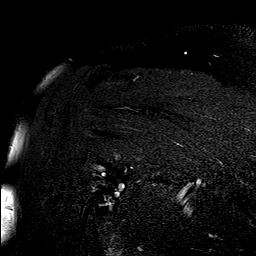
[im 5/18]
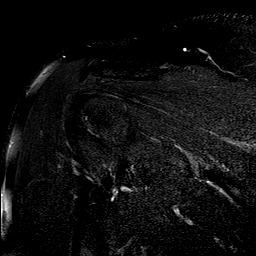
[im 8/18]
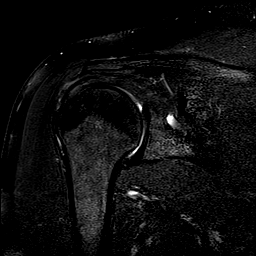
[im 10/18]
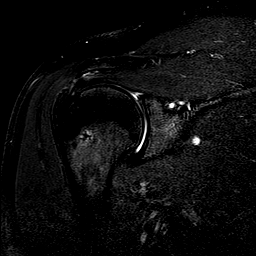
[im 13/18]
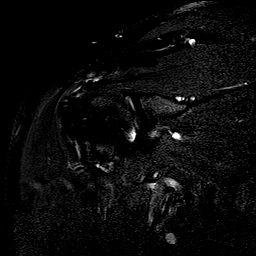
[im 15/18]
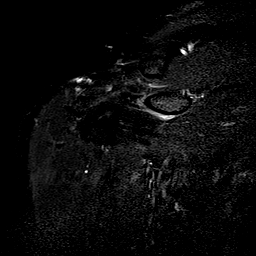
[im 18/18]
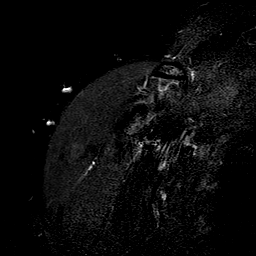

[Series 5: PD · oblique · 4.0mm · 0.59mm/px · 8 of 18 slices shown]
[im 1/18]
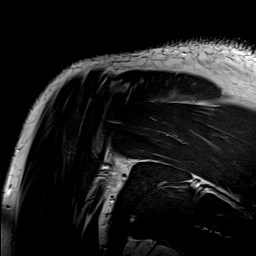
[im 3/18]
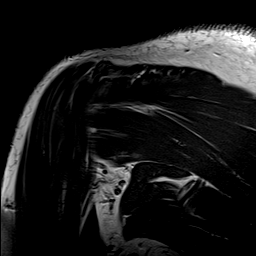
[im 5/18]
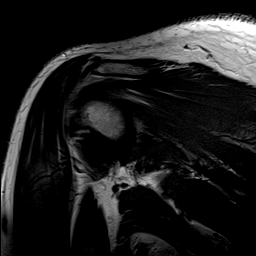
[im 8/18]
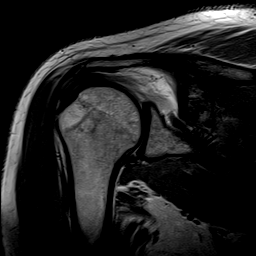
[im 10/18]
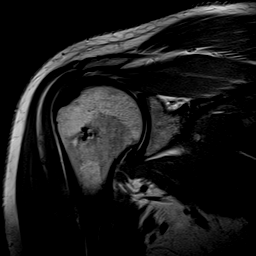
[im 13/18]
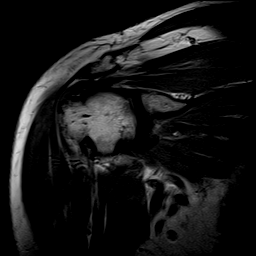
[im 15/18]
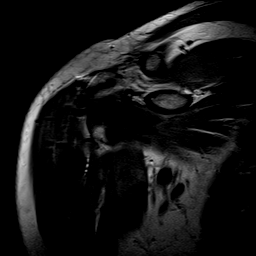
[im 18/18]
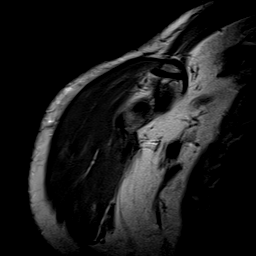

[Series 6: T1 · oblique · 4.0mm · 0.59mm/px · 8 of 20 slices shown]
[im 1/20]
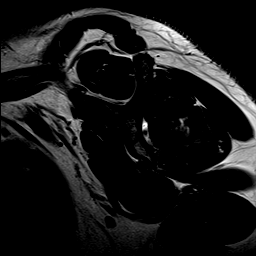
[im 3/20]
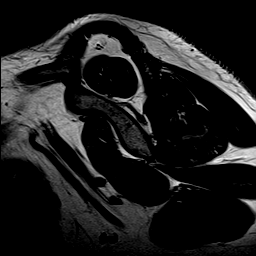
[im 6/20]
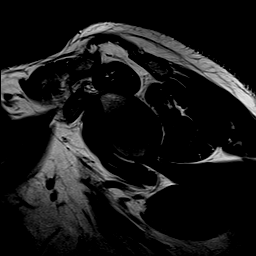
[im 9/20]
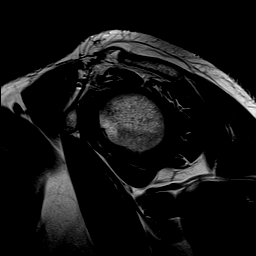
[im 11/20]
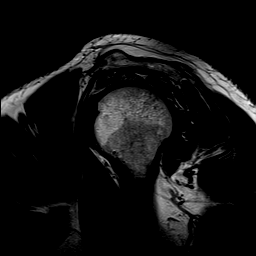
[im 14/20]
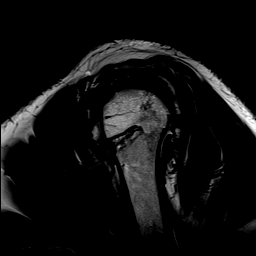
[im 17/20]
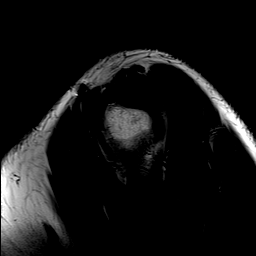
[im 20/20]
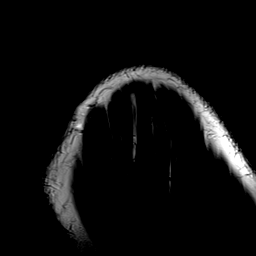

[Series 7: T2 fat-sat · oblique · 4.0mm · 0.59mm/px · 8 of 20 slices shown (3 of 3)]
[im 1/20]
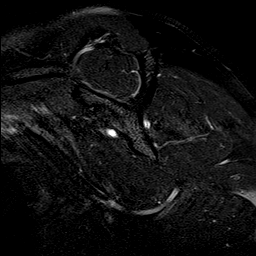
[im 3/20]
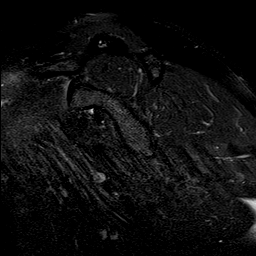
[im 6/20]
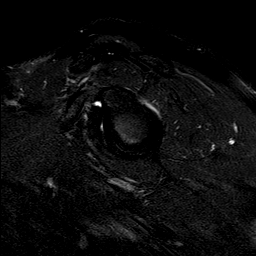
[im 9/20]
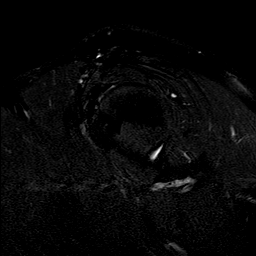
[im 11/20]
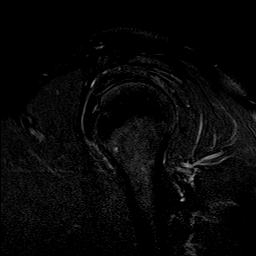
[im 14/20]
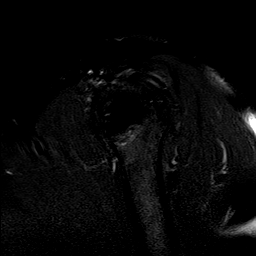
[im 17/20]
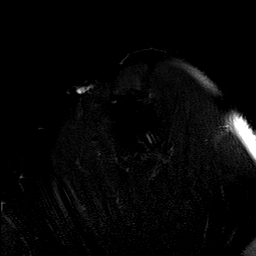
[im 20/20]
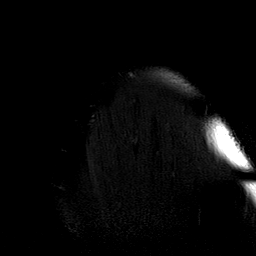

[40 of 40 positions shown; findings below may reference images not displayed]

FINDINGS: Rotator cuff: Mild to moderate rotator cuff tendinopathy is most
notable in the supraspinatus. 0.3 cm from front to back bursal sided
tear of the supraspinatus is identified without retraction.

Muscles:  Intact.  No atrophy or focal lesion.

Biceps long head: Status post tenodesis without evidence of
complication.

Acromioclavicular Joint: The joint has been resected. Type 2
acromion. Status post acromioplasty. No evidence of bursitis.

Glenohumeral Joint: Negative.

Labrum: The superior labrum appears diminutive which may be due to
prior surgery or degeneration. No tear is identified.

Bones:  Normal marrow signal throughout.

Other: None.
IMPRESSION: Rotator cuff tendinopathy with a 0.3 cm bursal sided tear of the
supraspinatus identified. No retraction or atrophy.

Status post acromioplasty, resection of the AC joint and biceps
tenodesis without evidence of complication.

## 2018-10-26 ENCOUNTER — Telehealth: Payer: Self-pay | Admitting: Neurology

## 2018-10-26 NOTE — Telephone Encounter (Signed)
Patient was on recall list for being overdue on DM follow up. He has transferred care to [facility] and is no longer a patient in this practice. Tandy Gaw removed as PCP.
